# Patient Record
Sex: Female | Born: 1945 | Race: White | Hispanic: No | Smoking: Never smoker
Health system: Southern US, Community
[De-identification: ages and names within clinical notes are randomized; demographics above are authoritative.]

## PROBLEM LIST (undated history)

## (undated) ENCOUNTER — Emergency Department: Admission: EM

## (undated) DIAGNOSIS — E785 Hyperlipidemia, unspecified: Secondary | ICD-10-CM

## (undated) DIAGNOSIS — F419 Anxiety disorder, unspecified: Secondary | ICD-10-CM

## (undated) DIAGNOSIS — G4730 Sleep apnea, unspecified: Secondary | ICD-10-CM

## (undated) DIAGNOSIS — K219 Gastro-esophageal reflux disease without esophagitis: Secondary | ICD-10-CM

## (undated) HISTORY — PX: COLONOSCOPY W/ POLYPECTOMY: SHX1380

## (undated) HISTORY — PX: CATARACT EXTRACTION: SUR2

## (undated) HISTORY — PX: OTHER SURGICAL HISTORY: SHX169

## (undated) HISTORY — PX: APPENDECTOMY: SHX54

## (undated) HISTORY — PX: SMALL INTESTINE SURGERY: SHX150

## (undated) HISTORY — PX: TUBAL LIGATION: SHX77

## (undated) HISTORY — PX: COLON SURGERY: SHX602

---

## 2017-01-12 ENCOUNTER — Emergency Department
Admission: EM | Admit: 2017-01-12 | Discharge: 2017-01-12 | Disposition: A | Discharge status: Home / Self Care | Payer: Medicare Other | Attending: Emergency Medicine | Admitting: Emergency Medicine

## 2017-01-12 ENCOUNTER — Encounter: Payer: Self-pay | Admitting: Emergency Medicine

## 2017-01-12 DIAGNOSIS — R040 Epistaxis: Secondary | ICD-10-CM | POA: Insufficient documentation

## 2017-01-12 HISTORY — DX: Hyperlipidemia, unspecified: E78.5

## 2017-01-12 HISTORY — DX: Anxiety disorder, unspecified: F41.9

## 2017-01-12 MED ORDER — PHENYLEPHRINE HCL 0.5 % NA SOLN
NASAL | Status: AC
  Filled 2017-01-12: qty 15

## 2017-01-12 NOTE — ED Triage Notes (Signed)
Pt arrives from home with c/o nosebleed. Pt reports that her nose has been bleeding x1 hour and that she has been having clots. Pt denies trauma to area. Pt is in NAD at this time.

## 2017-01-12 NOTE — ED Provider Notes (Signed)
[facility] Emergency Department Provider Note  Time seen: 1:43 AM  I have reviewed the triage vital signs and the nursing notes.   HISTORY  Chief Complaint Epistaxis    HPI Danielle Huber is a 71 y.o. female with a past medical history of anxiety, hyperlipidemia, presents to the emergency department with a nosebleed. According to the patient she was awoken from her sleep approximately 45 minutes prior to arrival with a nosebleed and tasting blood in her mouth. Patient states history of small nosebleeds in the past but this one was heavier and would not stop so she came to the emergency department. Denies any use of blood thinners including aspirin.  Past Medical History:  Diagnosis Date  . Anxiety   . Hyperlipidemia     There are no active problems to display for this patient.   Past Surgical History:  Procedure Laterality Date  . APPENDECTOMY    . SMALL INTESTINE SURGERY    . TUBAL LIGATION      Prior to Admission medications   Not on File    No Known Allergies  No family history on file.  Social History Social History  Substance Use Topics  . Smoking status: Never Smoker  . Smokeless tobacco: Never Used  . Alcohol use 1.8 oz/week    3 Glasses of wine per week    Review of Systems Constitutional: Negative for fever. Eyes: Nosebleed 45 minutes. Cardiovascular: Negative for chest pain. Respiratory: Negative for shortness of breath. Gastrointestinal: Negative for abdominal pain Neurological: Negative for headache 10-point ROS otherwise negative.  ____________________________________________   PHYSICAL EXAM:  VITAL SIGNS: ED Triage Vitals  Enc Vitals Group     BP 01/12/17 0129 (!) 149/110     Pulse Rate 01/12/17 0129 (!) 103     Resp 01/12/17 0129 18     Temp 01/12/17 0129 98 F (36.7 C)     Temp Source 01/12/17 0129 Oral     SpO2 01/12/17 0129 98 %     Weight 01/12/17 0130 185 lb (83.9 kg)     Height 01/12/17 0130 5\' 1"   (1.549 m)     Head Circumference --      Peak Flow --      Pain Score 01/12/17 0130 0     Pain Loc --      Pain Edu? --      Excl. in GC? --     Constitutional: Alert and oriented. Well appearing and in no distress. Eyes: Normal exam ENT   Head: Normocephalic and atraumatic.   Nose: Mild bleeding from left nostril. No obvious bleed on anterior septum identified.   Mouth/Throat: Mucous membranes are moist. Cardiovascular: Normal rate, regular rhythm. No murmur Respiratory: Normal respiratory effort without tachypnea nor retractions. Breath sounds are clear  Gastrointestinal: Soft and nontender. No distention.   Musculoskeletal: Nontender with normal range of motion in all extremities. Neurologic:  Normal speech and language. No gross focal neurologic deficits Skin:  Skin is warm, dry and intact.  Psychiatric: Mood and affect are normal.     INITIAL IMPRESSION / ASSESSMENT AND PLAN / ED COURSE  Pertinent labs & imaging results that were available during my care of the patient were reviewed by me and considered in my medical decision making (see chart for details).  The patient presents to the emergency department with a nosebleed. Currently she has a mild bleed from the left nostril. No obvious bleed identified with visual inspection. We will use Neo-Synephrine  and nasal clamp and recheck in 15 minutes.  Patient remains hemostatic. We will discharge home with Neo-Synephrine and nasal clamp if bleeding recurs. If bleeding is not able to be controlled at home patient will return to the emergency department, otherwise she will follow up with ENT as needed.  ____________________________________________   FINAL CLINICAL IMPRESSION(S) / ED DIAGNOSES  Epistaxis    Minna AntisKevin Caitlyne Ingham, MD 01/12/17 [phone]

## 2017-01-14 ENCOUNTER — Other Ambulatory Visit: Payer: Self-pay | Admitting: Internal Medicine

## 2017-01-14 DIAGNOSIS — Z1231 Encounter for screening mammogram for malignant neoplasm of breast: Secondary | ICD-10-CM

## 2017-02-08 ENCOUNTER — Ambulatory Visit
Admission: RE | Admit: 2017-02-08 | Discharge: 2017-02-08 | Disposition: A | Discharge status: Home / Self Care | Payer: Medicare Other | Source: Ambulatory Visit | Attending: Internal Medicine | Admitting: Internal Medicine

## 2017-02-08 DIAGNOSIS — Z1231 Encounter for screening mammogram for malignant neoplasm of breast: Secondary | ICD-10-CM | POA: Insufficient documentation

## 2017-02-11 ENCOUNTER — Other Ambulatory Visit: Payer: Self-pay | Admitting: Otolaryngology

## 2017-02-11 DIAGNOSIS — H9312 Tinnitus, left ear: Secondary | ICD-10-CM

## 2017-02-20 ENCOUNTER — Ambulatory Visit
Admission: RE | Admit: 2017-02-20 | Discharge: 2017-02-20 | Disposition: A | Discharge status: Home / Self Care | Payer: Medicare Other | Source: Ambulatory Visit | Attending: Otolaryngology | Admitting: Otolaryngology

## 2017-02-20 DIAGNOSIS — H9312 Tinnitus, left ear: Secondary | ICD-10-CM | POA: Insufficient documentation

## 2017-02-20 MED ORDER — GADOBENATE DIMEGLUMINE 529 MG/ML IV SOLN
15.0000 mL | Freq: Once | INTRAVENOUS | Status: AC | PRN
  Administered 2017-02-20: 15 mL via INTRAVENOUS

## 2017-09-26 ENCOUNTER — Ambulatory Visit (INDEPENDENT_AMBULATORY_CARE_PROVIDER_SITE_OTHER): Payer: Medicare Other

## 2017-09-26 ENCOUNTER — Ambulatory Visit (INDEPENDENT_AMBULATORY_CARE_PROVIDER_SITE_OTHER): Payer: Medicare Other | Admitting: Family

## 2017-09-26 ENCOUNTER — Encounter (INDEPENDENT_AMBULATORY_CARE_PROVIDER_SITE_OTHER): Payer: Self-pay | Admitting: Orthopedic Surgery

## 2017-09-26 VITALS — Ht 61.0 in | Wt 185.0 lb

## 2017-09-26 DIAGNOSIS — B029 Zoster without complications: Secondary | ICD-10-CM | POA: Diagnosis not present

## 2017-09-26 DIAGNOSIS — R252 Cramp and spasm: Secondary | ICD-10-CM

## 2017-09-26 DIAGNOSIS — M79605 Pain in left leg: Secondary | ICD-10-CM | POA: Diagnosis not present

## 2017-09-26 MED ORDER — VALACYCLOVIR HCL 1 G PO TABS: 1000.0000 mg | ORAL_TABLET | Freq: Three times a day (TID) | ORAL | 0 refills | Status: AC

## 2017-09-26 NOTE — Progress Notes (Signed)
Office Visit Note   Patient: Danielle Huber           Date of Birth: 10/16/1946           MRN: [id number] Visit Date: 09/26/2017              Requested by: Lauro RegulusAnderson, Marshall W, MD [address] [facility] [place], [place] [address] PCP: Lauro RegulusAnderson, Marshall W, MD  No chief complaint on file.     HPI: Patient is a 71 year old woman seen today for initial evaluation of 2 separate issues. Is having cramping pain to left foot along medial column sometimes involves 3rd toe. States this resolves with stretching and predominately occurs at night. On going for several months. Has not tried any medications for this.  Also having lateral ankle pain that radiates up the leg, to knee and sometimes involves thigh and lateral hip. Describes as shooting pain. No numbness, tingling, or heaviness. No weakness. Has had low back pain off and on for years, nothing "noteworthy." no injury to back recently.   Did notice 5 days ago a rash appeared on lateral aspect of lower leg on left. Looks like shingles, which she has had in past. No drainage. Mild burning pain and itching. Has been spreading proximally.   Assessment & Plan: Visit Diagnoses:  1. Leg pain, lateral, left   2. Herpes zoster without complication   3. Muscle cramp, nocturnal     Plan: Will treat for Herpes Zoster, given Valacyclovir. Possible lumbar radiculopathy, will hold on pred taper at this time until zoster resolves. Discussed adequate fluid intake as well as adding coconut water for muscle cramps. Will follow up in 2 weeks for ongoing pain or concerns.   Follow-Up Instructions: Return in about 2 weeks (around 10/10/2017).   Left Ankle Exam   Tenderness  The patient is experiencing no tenderness.  Swelling: none  Range of Motion  The patient has normal left ankle ROM.   Comments:  Able to perform single limb heel raise. No pain with resisted inversion or eversion.   Back Exam   Tenderness  The patient  is experiencing no tenderness.   Range of Motion  The patient has normal back ROM.  Muscle Strength  The patient has normal back strength.  Tests  Straight leg raise right: negative Straight leg raise left: negative  Other  Gait: normal       Patient is alert, oriented, no adenopathy, well-dressed, normal affect, normal respiratory effort. Left foot plantigrade. Does have vesicular rash over lateral lower leg, erythematous, no drainage or open ulcers.  Imaging: Xr Lumbar Spine 2-3 Views  Result Date: 09/26/2017 Radiographs of lumbar spine show degenerative changes. Mild spurring. No spondylolisthesis.   No images are attached to the encounter.  Labs: No results found for: HGBA1C, ESRSEDRATE, CRP, LABURIC, REPTSTATUS, GRAMSTAIN, CULT, LABORGA  Orders:  Orders Placed This Encounter  Procedures  . XR Lumbar Spine 2-3 Views   Meds ordered this encounter  Medications  . valACYclovir (VALTREX) 1000 MG tablet    Sig: Take 1 tablet (1,000 mg total) 3 (three) times daily by mouth.    Dispense:  21 tablet    Refill:  0     Procedures: No procedures performed  Clinical Data: No additional findings.  ROS:  All other systems negative, except as noted in the HPI. Review of Systems  Constitutional: Negative for chills and fever.  Cardiovascular: Negative for leg swelling.  Musculoskeletal: Positive  for myalgias.  Skin: Positive for color change and rash.  Neurological: Negative for weakness and numbness.    Objective: Vital Signs: Ht 5\' 1"  (1.549 m)   Wt 185 lb (83.9 kg)   BMI 34.96 kg/m   Specialty Comments:  No specialty comments available.  PMFS History: There are no active problems to display for this patient.  Past Medical History:  Diagnosis Date  . Anxiety   . Hyperlipidemia     History reviewed. No pertinent family history.  Past Surgical History:  Procedure Laterality Date  . APPENDECTOMY    . SMALL INTESTINE SURGERY    . TUBAL LIGATION       Social History   Occupational History  . Not on file  Tobacco Use  . Smoking status: Never Smoker  . Smokeless tobacco: Never Used  Substance and Sexual Activity  . Alcohol use: Yes    Alcohol/week: 1.8 oz    Types: 3 Glasses of wine per week  . Drug use: No  . Sexual activity: Not on file

## 2017-10-15 ENCOUNTER — Ambulatory Visit (INDEPENDENT_AMBULATORY_CARE_PROVIDER_SITE_OTHER): Payer: Medicare Other | Admitting: Orthopedic Surgery

## 2020-01-03 ENCOUNTER — Ambulatory Visit: Payer: Medicare Other | Attending: Internal Medicine

## 2020-01-03 DIAGNOSIS — Z23 Encounter for immunization: Secondary | ICD-10-CM

## 2020-01-03 NOTE — Progress Notes (Signed)
   Covid-19 Vaccination Clinic  Name:  LANDYN LORINCZ    MRN: [id number] DOB: [birth year]  01/03/2020  Ms. Wos was observed post Covid-19 immunization for 15 minutes without incidence. She was provided with Vaccine Information Sheet and instruction to access the V-Safe system.   Ms. Bittman was instructed to call 911 with any severe reactions post vaccine: Marland Kitchen Difficulty breathing  . Swelling of your face and throat  . A fast heartbeat  . A bad rash all over your body  . Dizziness and weakness    Immunizations Administered    Name Date Dose VIS Date Route   Pfizer COVID-19 Vaccine 01/03/2020 10:38 AM 0.3 mL 10/30/2019 Intramuscular   Manufacturer: [employer], [employer]   Lot: [id number]   NDC: [address]-7

## 2020-02-02 ENCOUNTER — Ambulatory Visit: Payer: Medicare Other | Attending: Internal Medicine

## 2020-02-02 DIAGNOSIS — Z23 Encounter for immunization: Secondary | ICD-10-CM

## 2020-02-02 NOTE — Progress Notes (Signed)
   Covid-19 Vaccination Clinic  Name:  NAZARIAH CADET    MRN: [id number] DOB: 02-Sep-1946  02/02/2020  Ms. Sirico was observed post Covid-19 immunization for 15 minutes without incident. She was provided with Vaccine Information Sheet and instruction to access the V-Safe system.   Ms. Chernick was instructed to call 911 with any severe reactions post vaccine: Marland Kitchen Difficulty breathing  . Swelling of face and throat  . A fast heartbeat  . A bad rash all over body  . Dizziness and weakness   Immunizations Administered    Name Date Dose VIS Date Route   Pfizer COVID-19 Vaccine 02/02/2020 10:43 AM 0.3 mL 10/30/2019 Intramuscular   Manufacturer: [employer], [employer]   Lot: [id number]   NDC: [address]-8

## 2020-10-05 ENCOUNTER — Other Ambulatory Visit: Payer: Self-pay | Admitting: Internal Medicine

## 2020-10-05 DIAGNOSIS — R932 Abnormal findings on diagnostic imaging of liver and biliary tract: Secondary | ICD-10-CM

## 2020-10-07 ENCOUNTER — Other Ambulatory Visit: Payer: Self-pay

## 2020-10-07 ENCOUNTER — Ambulatory Visit
Admission: RE | Admit: 2020-10-07 | Discharge: 2020-10-07 | Disposition: A | Discharge status: Home / Self Care | Payer: Medicare Other | Source: Ambulatory Visit | Attending: Internal Medicine | Admitting: Internal Medicine

## 2020-10-07 DIAGNOSIS — R932 Abnormal findings on diagnostic imaging of liver and biliary tract: Secondary | ICD-10-CM | POA: Diagnosis present

## 2021-10-05 ENCOUNTER — Other Ambulatory Visit: Payer: Self-pay | Admitting: Internal Medicine

## 2021-10-05 DIAGNOSIS — Z1231 Encounter for screening mammogram for malignant neoplasm of breast: Secondary | ICD-10-CM

## 2022-02-14 ENCOUNTER — Ambulatory Visit
Admission: RE | Admit: 2022-02-14 | Discharge: 2022-02-14 | Disposition: A | Discharge status: Home / Self Care | Payer: Medicare Other | Source: Ambulatory Visit | Attending: Internal Medicine | Admitting: Internal Medicine

## 2022-02-14 DIAGNOSIS — Z1231 Encounter for screening mammogram for malignant neoplasm of breast: Secondary | ICD-10-CM | POA: Insufficient documentation

## 2022-11-20 ENCOUNTER — Other Ambulatory Visit: Payer: Self-pay | Admitting: Internal Medicine

## 2022-11-20 DIAGNOSIS — R058 Other specified cough: Secondary | ICD-10-CM

## 2022-11-20 DIAGNOSIS — I10 Essential (primary) hypertension: Secondary | ICD-10-CM

## 2022-11-20 DIAGNOSIS — R0989 Other specified symptoms and signs involving the circulatory and respiratory systems: Secondary | ICD-10-CM

## 2022-11-29 ENCOUNTER — Ambulatory Visit
Admission: RE | Admit: 2022-11-29 | Discharge: 2022-11-29 | Disposition: A | Discharge status: Home / Self Care | Payer: Medicare Other | Source: Ambulatory Visit | Attending: Internal Medicine | Admitting: Internal Medicine

## 2022-11-29 DIAGNOSIS — R058 Other specified cough: Secondary | ICD-10-CM

## 2022-11-29 DIAGNOSIS — I10 Essential (primary) hypertension: Secondary | ICD-10-CM

## 2022-11-29 DIAGNOSIS — R0989 Other specified symptoms and signs involving the circulatory and respiratory systems: Secondary | ICD-10-CM

## 2022-11-29 MED ORDER — IOPAMIDOL (ISOVUE-300) INJECTION 61%
75.0000 mL | Freq: Once | INTRAVENOUS | Status: AC | PRN
  Administered 2022-11-29: 75 mL via INTRAVENOUS

## 2022-12-25 ENCOUNTER — Other Ambulatory Visit: Payer: Self-pay | Admitting: Pulmonary Disease

## 2022-12-25 DIAGNOSIS — R7989 Other specified abnormal findings of blood chemistry: Secondary | ICD-10-CM

## 2022-12-27 ENCOUNTER — Ambulatory Visit
Admission: RE | Admit: 2022-12-27 | Discharge: 2022-12-27 | Disposition: A | Discharge status: Home / Self Care | Payer: Medicare Other | Source: Ambulatory Visit | Attending: Pulmonary Disease | Admitting: Pulmonary Disease

## 2022-12-27 DIAGNOSIS — R7989 Other specified abnormal findings of blood chemistry: Secondary | ICD-10-CM | POA: Insufficient documentation

## 2022-12-27 MED ORDER — IOHEXOL 350 MG/ML SOLN
75.0000 mL | Freq: Once | INTRAVENOUS | Status: AC | PRN
  Administered 2022-12-27: 75 mL via INTRAVENOUS

## 2023-03-18 ENCOUNTER — Encounter: Payer: Self-pay | Admitting: *Deleted

## 2023-03-19 ENCOUNTER — Other Ambulatory Visit: Payer: Self-pay | Admitting: Internal Medicine

## 2023-03-19 DIAGNOSIS — J849 Interstitial pulmonary disease, unspecified: Secondary | ICD-10-CM

## 2023-03-19 DIAGNOSIS — R911 Solitary pulmonary nodule: Secondary | ICD-10-CM

## 2023-03-21 ENCOUNTER — Ambulatory Visit
Admission: RE | Admit: 2023-03-21 | Discharge: 2023-03-21 | Disposition: A | Discharge status: Home / Self Care | Payer: Medicare Other | Source: Ambulatory Visit | Attending: Internal Medicine | Admitting: Internal Medicine

## 2023-03-21 DIAGNOSIS — R911 Solitary pulmonary nodule: Secondary | ICD-10-CM

## 2023-03-21 DIAGNOSIS — J849 Interstitial pulmonary disease, unspecified: Secondary | ICD-10-CM

## 2023-03-22 ENCOUNTER — Encounter: Payer: Self-pay | Admitting: *Deleted

## 2023-03-25 ENCOUNTER — Other Ambulatory Visit: Payer: Self-pay

## 2023-03-25 ENCOUNTER — Encounter: Payer: Self-pay | Admitting: *Deleted

## 2023-03-25 ENCOUNTER — Encounter
Admission: RE | Disposition: A | Discharge status: Home / Self Care | Payer: Self-pay | Source: Ambulatory Visit | Attending: Gastroenterology

## 2023-03-25 ENCOUNTER — Ambulatory Visit: Payer: Medicare Other | Admitting: Anesthesiology

## 2023-03-25 ENCOUNTER — Ambulatory Visit
Admission: RE | Admit: 2023-03-25 | Discharge: 2023-03-25 | Disposition: A | Discharge status: Home / Self Care | Payer: Medicare Other | Source: Ambulatory Visit | Attending: Gastroenterology | Admitting: Gastroenterology

## 2023-03-25 DIAGNOSIS — K219 Gastro-esophageal reflux disease without esophagitis: Secondary | ICD-10-CM | POA: Diagnosis not present

## 2023-03-25 DIAGNOSIS — Z09 Encounter for follow-up examination after completed treatment for conditions other than malignant neoplasm: Secondary | ICD-10-CM | POA: Diagnosis not present

## 2023-03-25 DIAGNOSIS — F419 Anxiety disorder, unspecified: Secondary | ICD-10-CM | POA: Insufficient documentation

## 2023-03-25 DIAGNOSIS — K640 First degree hemorrhoids: Secondary | ICD-10-CM | POA: Insufficient documentation

## 2023-03-25 DIAGNOSIS — E785 Hyperlipidemia, unspecified: Secondary | ICD-10-CM | POA: Diagnosis not present

## 2023-03-25 DIAGNOSIS — G4733 Obstructive sleep apnea (adult) (pediatric): Secondary | ICD-10-CM | POA: Diagnosis not present

## 2023-03-25 DIAGNOSIS — Z1211 Encounter for screening for malignant neoplasm of colon: Secondary | ICD-10-CM | POA: Diagnosis present

## 2023-03-25 DIAGNOSIS — Z800 Family history of malignant neoplasm of digestive organs: Secondary | ICD-10-CM | POA: Diagnosis not present

## 2023-03-25 DIAGNOSIS — Z86010 Personal history of colonic polyps: Secondary | ICD-10-CM | POA: Diagnosis not present

## 2023-03-25 DIAGNOSIS — D122 Benign neoplasm of ascending colon: Secondary | ICD-10-CM | POA: Insufficient documentation

## 2023-03-25 HISTORY — DX: Sleep apnea, unspecified: G47.30

## 2023-03-25 HISTORY — DX: Gastro-esophageal reflux disease without esophagitis: K21.9

## 2023-03-25 HISTORY — PX: COLONOSCOPY WITH PROPOFOL: SHX5780

## 2023-03-25 SURGERY — COLONOSCOPY WITH PROPOFOL
Anesthesia: General

## 2023-03-25 MED ORDER — LIDOCAINE HCL (CARDIAC) PF 100 MG/5ML IV SOSY
PREFILLED_SYRINGE | INTRAVENOUS | Status: DC | PRN
  Administered 2023-03-25: 100 mg via INTRAVENOUS

## 2023-03-25 MED ORDER — PROPOFOL 500 MG/50ML IV EMUL
INTRAVENOUS | Status: DC | PRN
  Administered 2023-03-25: 165 ug/kg/min via INTRAVENOUS

## 2023-03-25 MED ORDER — SODIUM CHLORIDE 0.9 % IV SOLN: INTRAVENOUS | Status: DC

## 2023-03-25 MED ORDER — PROPOFOL 10 MG/ML IV BOLUS
INTRAVENOUS | Status: DC | PRN
  Administered 2023-03-25: 20 mg via INTRAVENOUS
  Administered 2023-03-25: 60 mg via INTRAVENOUS

## 2023-03-25 NOTE — Anesthesia Procedure Notes (Signed)
Procedure Name: General with mask airway Date/Time: 03/25/2023 12:30 PM  Performed by: Mohammed Kindle, CRNAPre-anesthesia Checklist: Patient identified, Emergency Drugs available, Suction available and Patient being monitored Patient Re-evaluated:Patient Re-evaluated prior to induction Oxygen Delivery Method: Simple face mask Induction Type: IV induction Placement Confirmation: positive ETCO2, CO2 detector and breath sounds checked- equal and bilateral Dental Injury: Teeth and Oropharynx as per pre-operative assessment

## 2023-03-25 NOTE — H&P (Signed)
Outpatient short stay form Pre-procedure 03/25/2023  Regis Bill, MD  Primary Physician: Lauro Regulus, MD  Reason for visit:  Surveillance  History of present illness:    77 y/o lady with history of OSA, HLD, and GERD here for colonoscopy due to history of polyp that required surgery many years ago. Her father had colon cancer in his 73's. No blood thinners.    Current Facility-Administered Medications:    0.9 %  sodium chloride infusion, , Intravenous, Continuous, Felma Pfefferle, Rossie Muskrat, MD, Last Rate: 20 mL/hr at 03/25/23 1211, New Bag at 03/25/23 1211  Medications Prior to Admission  Medication Sig Dispense Refill Last Dose   albuterol (VENTOLIN HFA) 108 (90 Base) MCG/ACT inhaler Inhale into the lungs every 6 (six) hours as needed for wheezing or shortness of breath.      escitalopram (LEXAPRO) 20 MG tablet Take 20 mg by mouth daily.   03/24/2023   pantoprazole (PROTONIX) 40 MG tablet Take 40 mg by mouth daily.   03/24/2023   rosuvastatin (CRESTOR) 20 MG tablet Take 20 mg by mouth daily.   03/24/2023   valACYclovir (VALTREX) 1000 MG tablet Take 1 tablet (1,000 mg total) 3 (three) times daily by mouth. 21 tablet 0      No Known Allergies   Past Medical History:  Diagnosis Date   Anxiety    GERD (gastroesophageal reflux disease)    Hyperlipidemia    Sleep apnea     Review of systems:  Otherwise negative.    Physical Exam  Gen: Alert, oriented. Appears stated age.  HEENT: PERRLA. Lungs: No respiratory distress CV: RRR Abd: soft, benign, no masses Ext: No edema    Planned procedures: Proceed with colonoscopy. The patient understands the nature of the planned procedure, indications, risks, alternatives and potential complications including but not limited to bleeding, infection, perforation, damage to internal organs and possible oversedation/side effects from anesthesia. The patient agrees and gives consent to proceed.  Please refer to procedure notes for  findings, recommendations and patient disposition/instructions.     Regis Bill, MD [facility]

## 2023-03-25 NOTE — Transfer of Care (Signed)
Immediate Anesthesia Transfer of Care Note  Patient: Danielle Huber  Procedure(s) Performed: COLONOSCOPY WITH PROPOFOL  Patient Location: Endoscopy Unit  Anesthesia Type:General  Level of Consciousness: drowsy and patient cooperative  Airway & Oxygen Therapy: Patient Spontanous Breathing and Patient connected to face mask oxygen  Post-op Assessment: Report given to RN and Post -op Vital signs reviewed and stable  Post vital signs: Reviewed and stable  Last Vitals:  Vitals Value Taken Time  BP    Temp    Pulse 72 03/25/23 1247  Resp 13 03/25/23 1247  SpO2 100 % 03/25/23 1247  Vitals shown include unvalidated device data.  Last Pain:  Vitals:   03/25/23 1154  TempSrc: Temporal  PainSc:          Complications: No notable events documented.

## 2023-03-25 NOTE — Interval H&P Note (Signed)
History and Physical Interval Note:  03/25/2023 12:26 PM  Danielle Huber  has presented today for surgery, with the diagnosis of h/o colon polyps.  The various methods of treatment have been discussed with the patient and family. After consideration of risks, benefits and other options for treatment, the patient has consented to  Procedure(s): COLONOSCOPY WITH PROPOFOL (N/A) as a surgical intervention.  The patient's history has been reviewed, patient examined, no change in status, stable for surgery.  I have reviewed the patient's chart and labs.  Questions were answered to the patient's satisfaction.     Regis Bill  Ok to proceed with colonoscopy

## 2023-03-25 NOTE — Op Note (Signed)
[facility] Patient Name: Danielle Huber Procedure Date: 03/25/2023 11:36 AM MRN: [id number] Account #: [id number] Date of Birth: 1946/07/25 Admit Type: Outpatient Age: 77 Room: [facility] ENDO ROOM 3 Gender: Female Note Status: Finalized Instrument Name: Nelda Marseille [id number] Procedure:             Colonoscopy Indications:           Surveillance: Personal history of colonic polyps                         (unknown histology) on last colonoscopy more than 5                         years ago Providers:             Eather Colas MD, MD Referring MD:          Marya Amsler. Dareen Piano MD, MD (Referring MD) Medicines:             Monitored Anesthesia Care Complications:         No immediate complications. Estimated blood loss:                         Minimal. Procedure:             Pre-Anesthesia Assessment:                        - Prior to the procedure, a History and Physical was                         performed, and patient medications and allergies were                         reviewed. The patient is competent. The risks and                         benefits of the procedure and the sedation options and                         risks were discussed with the patient. All questions                         were answered and informed consent was obtained.                         Patient identification and proposed procedure were                         verified by the physician, the nurse, the                         anesthesiologist, the anesthetist and the technician                         in the endoscopy suite. Mental Status Examination:                         alert and oriented. Airway Examination: normal  oropharyngeal airway and neck mobility. Respiratory                         Examination: clear to auscultation. CV Examination:                         normal. Prophylactic Antibiotics: The patient does not                          require prophylactic antibiotics. Prior                         Anticoagulants: The patient has taken no anticoagulant                         or antiplatelet agents. ASA Grade Assessment: III - A                         patient with severe systemic disease. After reviewing                         the risks and benefits, the patient was deemed in                         satisfactory condition to undergo the procedure. The                         anesthesia plan was to use monitored anesthesia care                         (MAC). Immediately prior to administration of                         medications, the patient was re-assessed for adequacy                         to receive sedatives. The heart rate, respiratory                         rate, oxygen saturations, blood pressure, adequacy of                         pulmonary ventilation, and response to care were                         monitored throughout the procedure. The physical                         status of the patient was re-assessed after the                         procedure.                        After obtaining informed consent, the colonoscope was                         passed under direct vision. Throughout the procedure,  the patient's blood pressure, pulse, and oxygen                         saturations were monitored continuously. The                         Colonoscope was introduced through the anus and                         advanced to the the terminal ileum. The colonoscopy                         was performed without difficulty. The patient                         tolerated the procedure well. The quality of the bowel                         preparation was good. The terminal ileum, ileocecal                         valve, appendiceal orifice, and rectum were                         photographed. Findings:      The perianal and digital rectal examinations were normal.      The terminal  ileum appeared normal.      A 2 mm polyp was found in the ascending colon. The polyp was sessile.       The polyp was removed with a jumbo cold forceps. Resection and retrieval       were complete. Estimated blood loss was minimal.      Internal hemorrhoids were found during retroflexion. The hemorrhoids       were Grade I (internal hemorrhoids that do not prolapse).      The exam was otherwise without abnormality on direct and retroflexion       views. Impression:            - The examined portion of the ileum was normal.                        - One 2 mm polyp in the ascending colon, removed with                         a jumbo cold forceps. Resected and retrieved.                        - Internal hemorrhoids.                        - The examination was otherwise normal on direct and                         retroflexion views. Recommendation:        - Discharge patient to home.                        - Resume previous diet.                        -  Continue present medications.                        - Await pathology results.                        - Repeat colonoscopy in 5 years for surveillance.                        - Return to referring physician as previously                         scheduled. Procedure Code(s):     --- Professional ---                        [phone], Colonoscopy, flexible; with biopsy, single or                         multiple Diagnosis Code(s):     --- Professional ---                        Z86.010, Personal history of colonic polyps                        K64.0, First degree hemorrhoids                        D12.2, Benign neoplasm of ascending colon CPT copyright 2022 American Medical Association. All rights reserved. The codes documented in this report are preliminary and upon coder review may  be revised to meet current compliance requirements. Eather Colas MD, MD 03/25/2023 12:53:20 PM Number of Addenda: 0 Note Initiated On: 03/25/2023 11:36  AM Scope Withdrawal Time: 0 hours 6 minutes 1 second  Total Procedure Duration: 0 hours 10 minutes 34 seconds  Estimated Blood Loss:  Estimated blood loss was minimal.      [facility]

## 2023-03-25 NOTE — Anesthesia Preprocedure Evaluation (Signed)
Anesthesia Evaluation  Patient identified by MRN, date of birth, ID band Patient awake    Reviewed: Allergy & Precautions, NPO status , Patient's Chart, lab work & pertinent test results  History of Anesthesia Complications Negative for: history of anesthetic complications  Airway Mallampati: III  TM Distance: <3 FB Neck ROM: full    Dental  (+) Chipped   Pulmonary neg shortness of breath, sleep apnea    Pulmonary exam normal        Cardiovascular Exercise Tolerance: Good (-) angina (-) Past MI negative cardio ROS Normal cardiovascular exam     Neuro/Psych negative neurological ROS  negative psych ROS   GI/Hepatic Neg liver ROS,GERD  Controlled,,  Endo/Other  negative endocrine ROS    Renal/GU negative Renal ROS  negative genitourinary   Musculoskeletal   Abdominal   Peds  Hematology negative hematology ROS (+)   Anesthesia Other Findings Past Medical History: No date: Anxiety No date: GERD (gastroesophageal reflux disease) No date: Hyperlipidemia No date: Sleep apnea  Past Surgical History: No date: APPENDECTOMY No date: carpel tunnel release No date: CATARACT EXTRACTION No date: COLON SURGERY No date: COLONOSCOPY W/ POLYPECTOMY No date: SMALL INTESTINE SURGERY No date: TUBAL LIGATION  BMI    Body Mass Index: 36.47 kg/m      Reproductive/Obstetrics negative OB ROS                             Anesthesia Physical Anesthesia Plan  ASA: 3  Anesthesia Plan: General   Post-op Pain Management:    Induction: Intravenous  PONV Risk Score and Plan: Propofol infusion and TIVA  Airway Management Planned: Natural Airway and Nasal Cannula  Additional Equipment:   Intra-op Plan:   Post-operative Plan:   Informed Consent: I have reviewed the patients History and Physical, chart, labs and discussed the procedure including the risks, benefits and alternatives for the  proposed anesthesia with the patient or authorized representative who has indicated his/her understanding and acceptance.     Dental Advisory Given  Plan Discussed with: Anesthesiologist, CRNA and Surgeon  Anesthesia Plan Comments: (Patient consented for risks of anesthesia including but not limited to:  - adverse reactions to medications - risk of airway placement if required - damage to eyes, teeth, lips or other oral mucosa - nerve damage due to positioning  - sore throat or hoarseness - Damage to heart, brain, nerves, lungs, other parts of body or loss of life  Patient voiced understanding.)       Anesthesia Quick Evaluation

## 2023-03-25 NOTE — Anesthesia Postprocedure Evaluation (Signed)
Anesthesia Post Note  Patient: Danielle Huber  Procedure(s) Performed: COLONOSCOPY WITH PROPOFOL  Patient location during evaluation: Endoscopy Anesthesia Type: General Level of consciousness: awake and alert Pain management: pain level controlled Vital Signs Assessment: post-procedure vital signs reviewed and stable Respiratory status: spontaneous breathing, nonlabored ventilation, respiratory function stable and patient connected to nasal cannula oxygen Cardiovascular status: blood pressure returned to baseline and stable Postop Assessment: no apparent nausea or vomiting Anesthetic complications: no   No notable events documented.   Last Vitals:  Vitals:   03/25/23 1258 03/25/23 1308  BP:  118/75  Pulse: 71 66  Resp:    Temp:    SpO2:      Last Pain:  Vitals:   03/25/23 1308  TempSrc:   PainSc: 0-No pain                 Cleda Mccreedy Clayborn Milnes

## 2023-03-26 ENCOUNTER — Encounter: Payer: Self-pay | Admitting: Gastroenterology

## 2023-03-26 LAB — SURGICAL PATHOLOGY

## 2023-09-04 IMAGING — MG MM DIGITAL SCREENING BILAT W/ TOMO AND CAD
8 series · 8 of 24 positions shown · non-contrast
Comparison: Previous exam(s).

CLINICAL DATA: Screening.

EXAM:
DIGITAL SCREENING BILATERAL MAMMOGRAM WITH TOMOSYNTHESIS AND CAD
TECHNIQUE: Bilateral screening digital craniocaudal and mediolateral oblique
mammograms were obtained. Bilateral screening digital breast
tomosynthesis was performed. The images were evaluated with
computer-aided detection.

[R CC synth-2D]
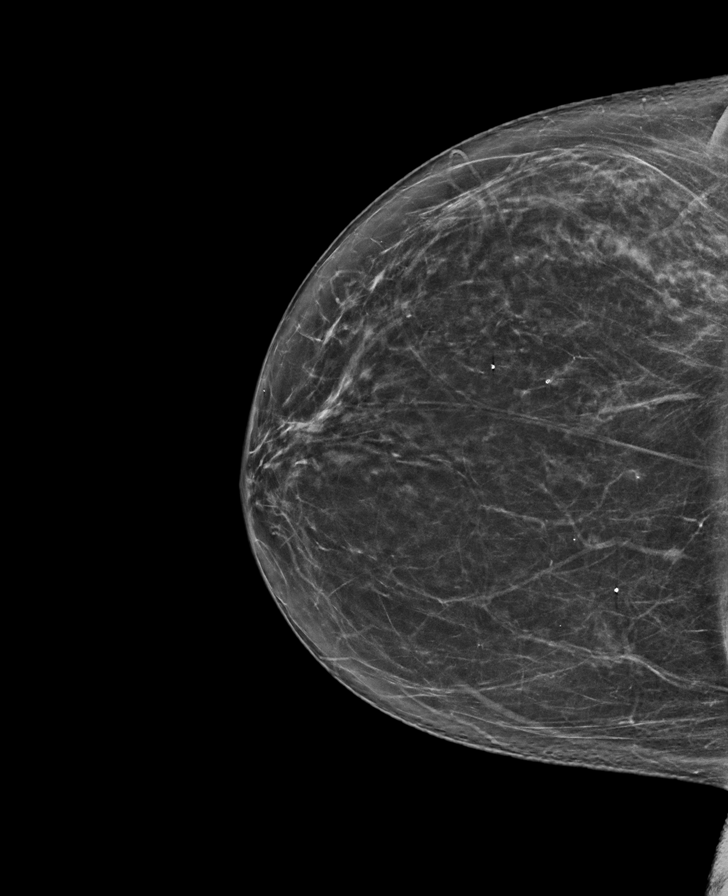

[L MLO synth-2D]
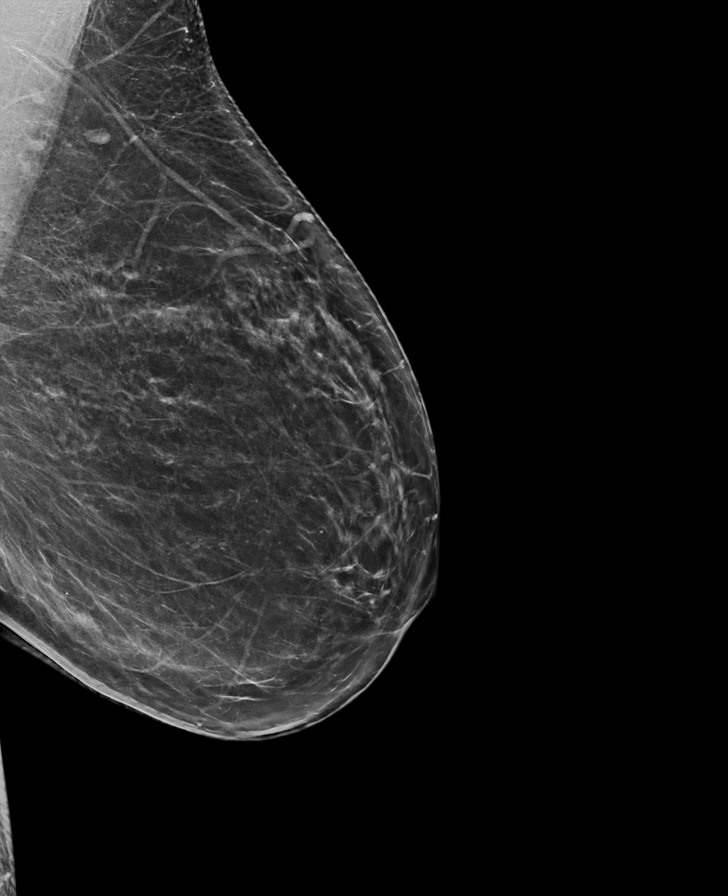

[R MLO synth-2D]
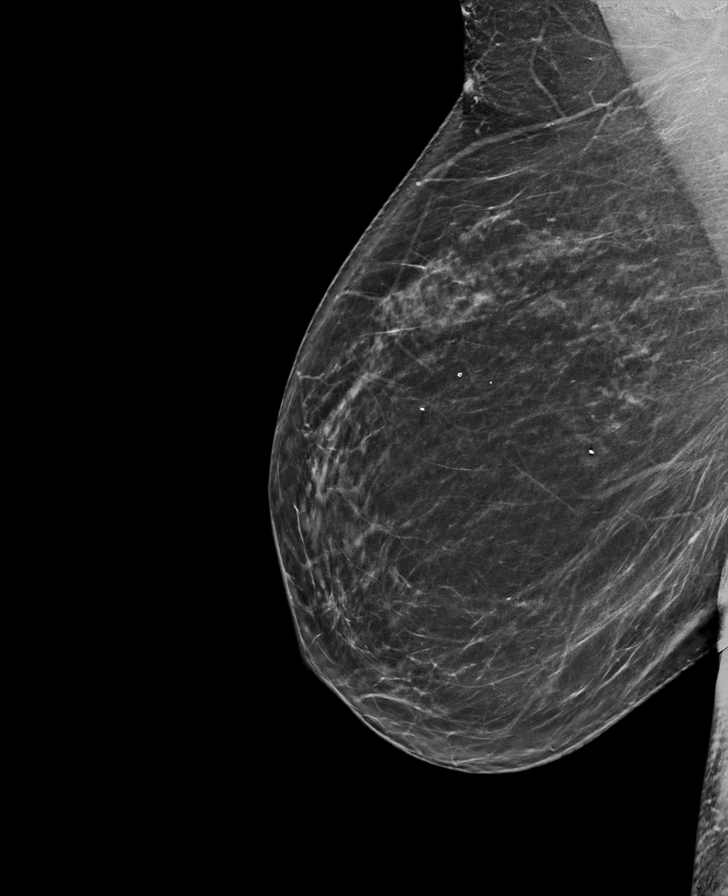

[L CC synth-2D]
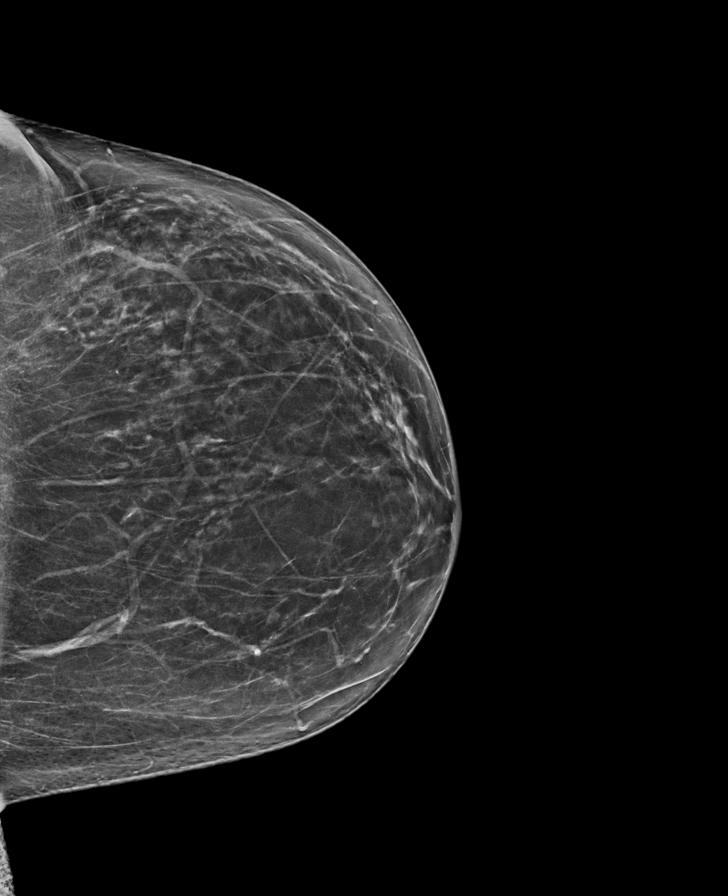

[R MLO tomo · tomo slice 39/78.0]
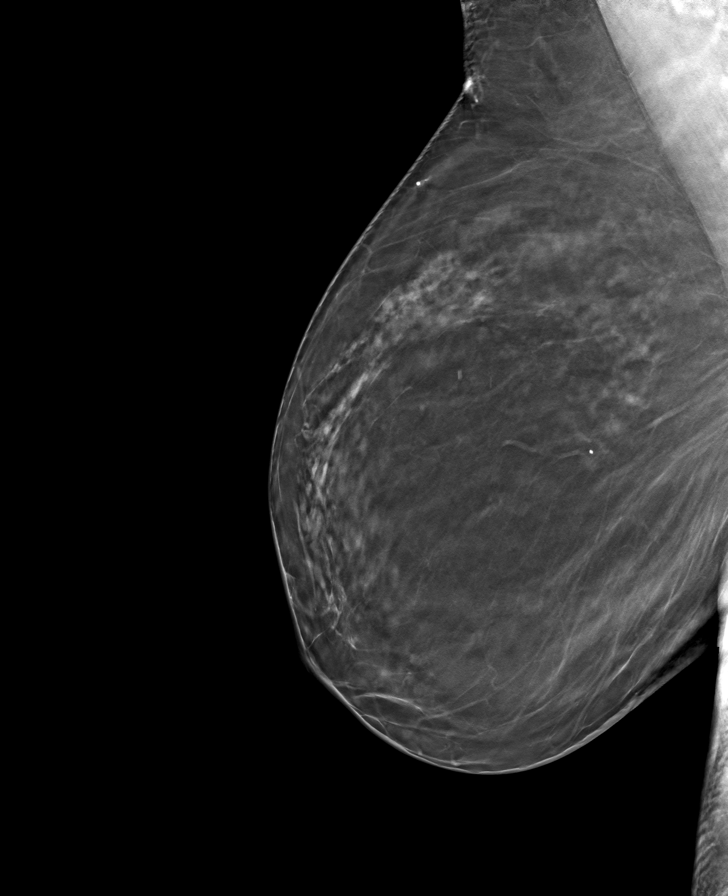

[R CC tomo · tomo slice 37/73.0]
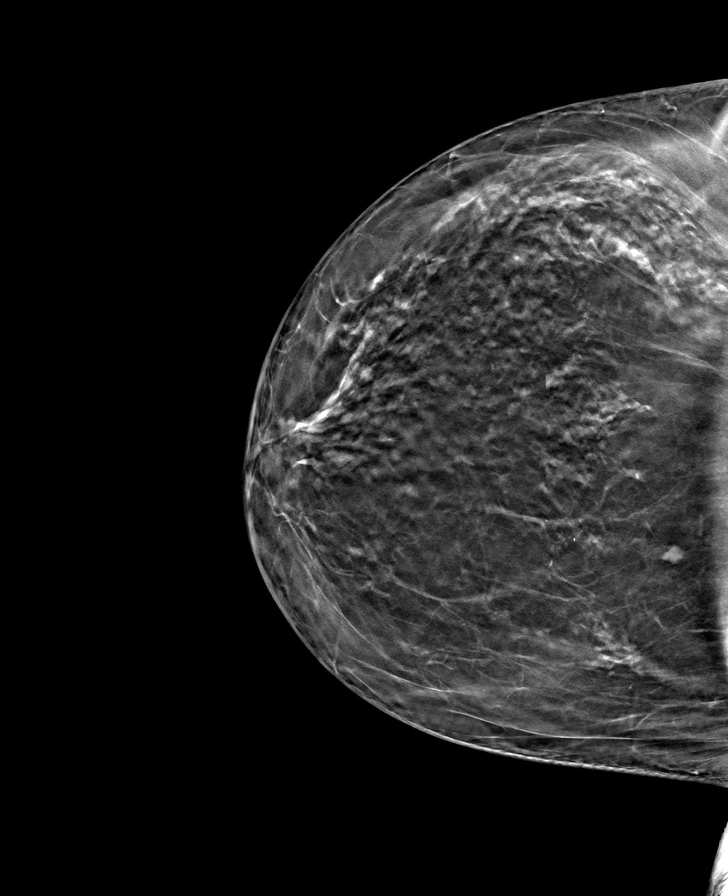

[L CC tomo · tomo slice 36/71.0]
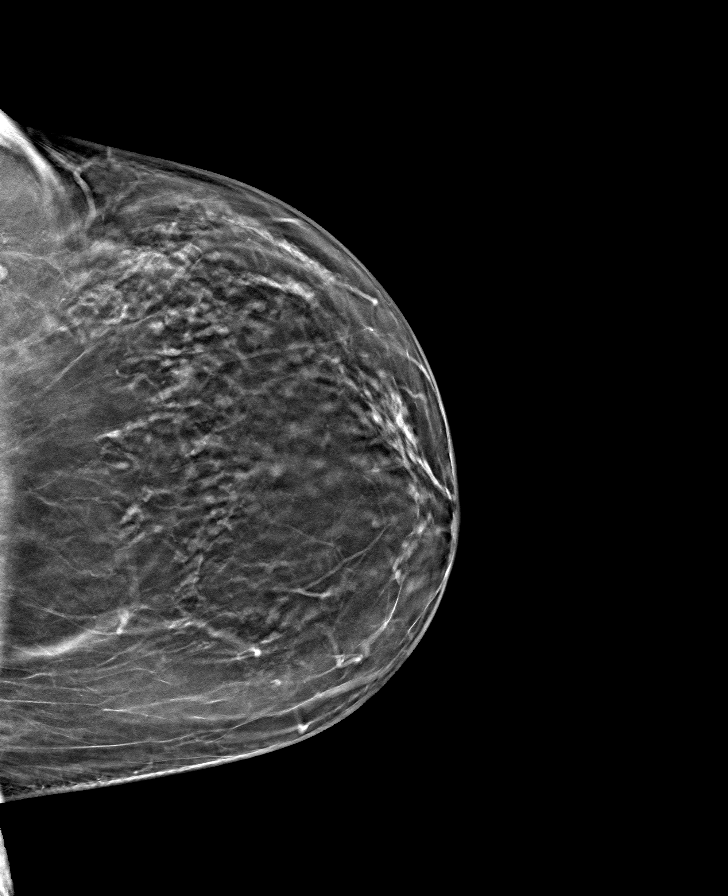

[L MLO tomo · tomo slice 40/79.0]
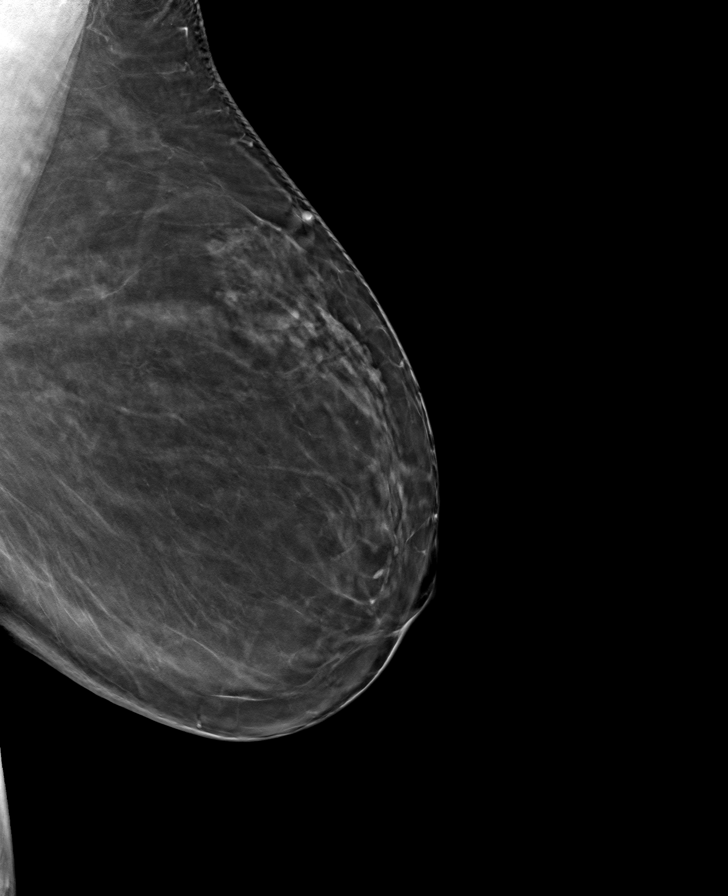

[8 of 24 positions shown; findings below may reference images not displayed]

ACR Breast Density Category b: There are scattered areas of
fibroglandular density.
FINDINGS: There are no findings suspicious for malignancy.
IMPRESSION: No mammographic evidence of malignancy. A result letter of this
screening mammogram will be mailed directly to the patient.

RECOMMENDATION:
Screening mammogram in one year. (Code:51-O-LD2)

BI-RADS CATEGORY  1: Negative.

## 2024-03-18 ENCOUNTER — Other Ambulatory Visit: Payer: Self-pay | Admitting: Emergency Medicine

## 2024-03-18 DIAGNOSIS — R911 Solitary pulmonary nodule: Secondary | ICD-10-CM

## 2024-04-09 ENCOUNTER — Other Ambulatory Visit: Payer: Self-pay | Admitting: Pulmonary Disease

## 2024-04-09 DIAGNOSIS — R911 Solitary pulmonary nodule: Secondary | ICD-10-CM

## 2024-04-17 ENCOUNTER — Ambulatory Visit
Admission: RE | Admit: 2024-04-17 | Discharge: 2024-04-17 | Disposition: A | Discharge status: Home / Self Care | Source: Ambulatory Visit | Attending: Pulmonary Disease | Admitting: Pulmonary Disease

## 2024-04-17 DIAGNOSIS — R911 Solitary pulmonary nodule: Secondary | ICD-10-CM | POA: Insufficient documentation

## 2024-08-08 ENCOUNTER — Emergency Department
Admission: EM | Admit: 2024-08-08 | Discharge: 2024-08-08 | Disposition: A | Discharge status: Home / Self Care | Attending: Emergency Medicine | Admitting: Emergency Medicine

## 2024-08-08 ENCOUNTER — Emergency Department

## 2024-08-08 ENCOUNTER — Other Ambulatory Visit: Payer: Self-pay

## 2024-08-08 DIAGNOSIS — M7989 Other specified soft tissue disorders: Secondary | ICD-10-CM | POA: Insufficient documentation

## 2024-08-08 DIAGNOSIS — S4992XA Unspecified injury of left shoulder and upper arm, initial encounter: Secondary | ICD-10-CM | POA: Diagnosis present

## 2024-08-08 DIAGNOSIS — Y9301 Activity, walking, marching and hiking: Secondary | ICD-10-CM | POA: Diagnosis not present

## 2024-08-08 DIAGNOSIS — I10 Essential (primary) hypertension: Secondary | ICD-10-CM | POA: Diagnosis not present

## 2024-08-08 DIAGNOSIS — S42202A Unspecified fracture of upper end of left humerus, initial encounter for closed fracture: Secondary | ICD-10-CM | POA: Diagnosis not present

## 2024-08-08 DIAGNOSIS — S43032A Inferior subluxation of left humerus, initial encounter: Secondary | ICD-10-CM | POA: Insufficient documentation

## 2024-08-08 DIAGNOSIS — S42292A Other displaced fracture of upper end of left humerus, initial encounter for closed fracture: Secondary | ICD-10-CM

## 2024-08-08 DIAGNOSIS — T148XXA Other injury of unspecified body region, initial encounter: Secondary | ICD-10-CM

## 2024-08-08 DIAGNOSIS — W19XXXA Unspecified fall, initial encounter: Secondary | ICD-10-CM | POA: Diagnosis not present

## 2024-08-08 MED ORDER — KETOROLAC TROMETHAMINE 30 MG/ML IJ SOLN
60.0000 mg | Freq: Once | INTRAMUSCULAR | Status: AC
  Administered 2024-08-08: 60 mg via INTRAMUSCULAR
  Filled 2024-08-08: qty 2

## 2024-08-08 MED ORDER — NAPROXEN 500 MG PO TABS: 500.0000 mg | ORAL_TABLET | Freq: Two times a day (BID) | ORAL | 0 refills | Status: AC

## 2024-08-08 NOTE — ED Triage Notes (Signed)
 Pt comes in via pov with complaints of left arm pain. Pt states that she was walking to giver her grandson a drink at his soccer game when she tripped over the back part of the bench, falling on her right arm. Pt complains of pain from her elbow to her shoulder. Pt states that she is unable to mover her arm at this time. Pt complains of pain 9/10 at this time.

## 2024-08-08 NOTE — Discharge Instructions (Signed)
 You have been diagnosed with left humeral fracture, and left humeral subluxation of the humerus.  Please take naproxen  1 tablet every 12 hours after main meals.  Please apply ice pack in the left shoulder.  Please use the left shoulder immobilizer.  Please call [facility] Dr. Cleotilde and make an appointment on Monday for a follow-up.  Come back to ED or go to your PCP if you have new symptoms or symptoms worsen.  It was a pleasure to help you today.

## 2024-08-08 NOTE — ED Provider Notes (Signed)
 [facility] Provider Note    Event Date/Time   First MD Initiated Contact with Patient 08/08/24 1524     (approximate)   History   Arm Injury    HPI  Danielle Huber is a 78 y.o. female    with a past medical history of hypertension, IPF, ILD, bilateral carpal tunnel finger ring finger, who presents to the ED complaining of left arm pain. According to the patient, she fell today on her right shoulder.  Patient endorses pain is 9/10 on her right shoulder, denies numbness and tingling.  Patient denies loss of consciousness, blurry vision.  Patient is not taking any blood thinner.  Patient is here with a friend.      There are no active problems to display for this patient.    ROS: Patient currently denies any vision changes, tinnitus, difficulty speaking, facial droop, sore throat, chest pain, shortness of breath, abdominal pain, nausea/vomiting/diarrhea, dysuria, or weakness/numbness/paresthesias in any extremity   Physical Exam   Triage Vital Signs: ED Triage Vitals [08/08/24 1359]  Encounter Vitals Group     BP (!) 152/79     Girls Systolic BP Percentile      Girls Diastolic BP Percentile      Boys Systolic BP Percentile      Boys Diastolic BP Percentile      Pulse Rate 99     Resp 18     Temp 98.4 F (36.9 C)     Temp src      SpO2 97 %     Weight 200 lb (90.7 kg)     Height 5' 2 (1.575 m)     Head Circumference      Peak Flow      Pain Score 9     Pain Loc      Pain Education      Exclude from Growth Chart     Most recent vital signs: Vitals:   08/08/24 1359  BP: (!) 152/79  Pulse: 99  Resp: 18  Temp: 98.4 F (36.9 C)  SpO2: 97%     Physical Exam Vitals and nursing note reviewed.  During triage patient was hypertensive  Constitutional:      General: Awake and alert. No acute distress.    Appearance: Normal appearance. The patient is normal weight.      Able to speak in complete sentences without cough or dyspnea  HENT:      Head: Normocephalic and atraumatic.     Mouth: Mucous membranes are moist.  Presence of ecchymoses in the right corner of the upper lip. Eyes:     General: PERRL. Normal EOMs          Conjunctiva/sclera: Conjunctivae normal.  Nose No congestion/rhinorrhea.  Right nostril: Evidence of dried blood.  CV:                  Good peripheral perfusion.  Regular rate and rhythm  Resp:               Normal effort.  Equal breath sounds bilaterally.  Abd:                 No distention.  Soft, nontender.  No rebound or guarding.  Musculoskeletal:        General: No swelling. Normal range of motion.  Left shoulder: Skin is intact, no ecchymosis no hematomas, deformity.  Left hand: Strength 5/5, pulses positive, sensation is intact. Skin:    General:  Skin is warm and dry.     Capillary Refill: Capillary refill takes less than 2 seconds.     Findings: No rash.  Neurological:     Mental Status: The patient is awake and alert. MAE spontaneously. No gross focal neurologic deficits are appreciated.  Psychiatric Mood and affect are normal. Speech and behavior are normal.  ED Results / Procedures / Treatments   Labs (all labs ordered are listed, but only abnormal results are displayed) Labs Reviewed - No data to display    RADIOLOGY I independently reviewed and interpreted imaging and agree with radiologists findings.      PROCEDURES:  Critical Care performed:   Procedures   MEDICATIONS ORDERED IN ED: Medications  ketorolac  (TORADOL ) 30 MG/ML injection 60 mg (60 mg Intramuscular Given 08/08/24 1717)   Clinical Course as of 08/08/24 1728  Sat Aug 08, 2024  1630 DG Humerus Left 1. Comminuted fracture of humeral head and surgical neck, with fracture line extending to the greater tuberosity. 2. Inferior subluxation of the humeral head with respect to the glenoid.   [AE]  1722 Consulted orthopedics on-call, Dr. Cleotilde who recommended shoulder immobilizer, pain medication, follow-up  in 2 to 3 days [facility].  Patient was updated she is agreeable with the plan patient is ready for discharge. [AE]    Clinical Course User Index [AE] Janit Kast, PA-C    IMPRESSION / MDM / ASSESSMENT AND PLAN / ED COURSE  I reviewed the triage vital signs and the nursing notes.  Differential diagnosis includes, but is not limited to, fracture, dislocation, soft tissue injury  Patient's presentation is most consistent with acute complicated illness / injury requiring diagnostic workup.   Danielle Huber is a 78 y.o., female who presents today with history of fall on her right shoulder without loss of consciousness.  Denies taking blood thinner.  Physical exam skin is intact no ecchymosis no hematomas deformity.  Sensation is intact, pulses positive.  On her face evidence of hematoma in the right corner of the upper lip, right nostril with dried blood. Plan Toradol  Sling Call orthopedics Dr. Cleotilde orthopedics on-call was consulted who recommended shoulder immobilizer, pain medication and follow-up with him in 2 days. Patient's diagnosis is consistent with comminuted fracture of left humeral head and surgical neck, inferior subluxation of humeral head. I independently reviewed and interpreted imaging and agree with radiologists findings.  I did not order any labs, physical exam is reassuring. I did review the patient's allergies and medications.The patient is in stable and satisfactory condition for discharge home  Patient will be discharged home with prescriptions for naproxen . Patient is to follow up with Dr. Cleotilde at [facility] as needed or otherwise directed. Patient is given ED precautions to return to the ED for any worsening or new symptoms. Discussed plan of care with patient, answered all of patient's questions, Patient agreeable to plan of care. Advised patient to take medications according to the instructions on the label. Discussed possible side effects of new medications.  Patient verbalized understanding.   FINAL CLINICAL IMPRESSION(S) / ED DIAGNOSES   Final diagnoses:  Fall, initial encounter  Humeral head fracture, left, closed, initial encounter  Subluxation     Rx / DC Orders   ED Discharge Orders          Ordered    naproxen  (NAPROSYN ) 500 MG tablet  2 times daily with meals        08/08/24 1728  Note:  This document was prepared using Dragon voice recognition software and may include unintentional dictation errors.   Janit Kast, PA-C 08/08/24 1728    Bradler, Evan K, MD 08/08/24 [phone]
